# Patient Record
Sex: Female | Born: 1990
Health system: Southern US, Community
[De-identification: ages and names within clinical notes are randomized; demographics above are authoritative.]

## PROBLEM LIST (undated history)

## (undated) ENCOUNTER — Inpatient Hospital Stay (HOSPITAL_COMMUNITY): Payer: Self-pay

## (undated) DIAGNOSIS — O99345 Other mental disorders complicating the puerperium: Secondary | ICD-10-CM

## (undated) DIAGNOSIS — J383 Other diseases of vocal cords: Secondary | ICD-10-CM

## (undated) DIAGNOSIS — J45909 Unspecified asthma, uncomplicated: Secondary | ICD-10-CM

## (undated) DIAGNOSIS — R000 Tachycardia, unspecified: Secondary | ICD-10-CM

## (undated) DIAGNOSIS — F32A Depression, unspecified: Secondary | ICD-10-CM

## (undated) DIAGNOSIS — F419 Anxiety disorder, unspecified: Secondary | ICD-10-CM

## (undated) DIAGNOSIS — F329 Major depressive disorder, single episode, unspecified: Secondary | ICD-10-CM

## (undated) DIAGNOSIS — F53 Postpartum depression: Secondary | ICD-10-CM

## (undated) DIAGNOSIS — M5126 Other intervertebral disc displacement, lumbar region: Secondary | ICD-10-CM

## (undated) DIAGNOSIS — F4310 Post-traumatic stress disorder, unspecified: Secondary | ICD-10-CM

## (undated) HISTORY — PX: NO PAST SURGERIES: SHX2092

## (undated) HISTORY — DX: Other intervertebral disc displacement, lumbar region: M51.26

---

## 2014-06-23 ENCOUNTER — Emergency Department
Admission: EM | Admit: 2014-06-23 | Discharge: 2014-06-23 | Disposition: A | Discharge status: Home / Self Care | Payer: Managed Care, Other (non HMO) | Attending: Emergency Medicine | Admitting: Emergency Medicine

## 2014-06-23 ENCOUNTER — Encounter: Payer: Self-pay | Admitting: *Deleted

## 2014-06-23 DIAGNOSIS — Z79899 Other long term (current) drug therapy: Secondary | ICD-10-CM | POA: Insufficient documentation

## 2014-06-23 DIAGNOSIS — J45901 Unspecified asthma with (acute) exacerbation: Secondary | ICD-10-CM | POA: Insufficient documentation

## 2014-06-23 DIAGNOSIS — R000 Tachycardia, unspecified: Secondary | ICD-10-CM | POA: Insufficient documentation

## 2014-06-23 DIAGNOSIS — Y9389 Activity, other specified: Secondary | ICD-10-CM | POA: Insufficient documentation

## 2014-06-23 DIAGNOSIS — T7840XA Allergy, unspecified, initial encounter: Secondary | ICD-10-CM | POA: Insufficient documentation

## 2014-06-23 DIAGNOSIS — Y998 Other external cause status: Secondary | ICD-10-CM | POA: Diagnosis not present

## 2014-06-23 DIAGNOSIS — Y9289 Other specified places as the place of occurrence of the external cause: Secondary | ICD-10-CM | POA: Diagnosis not present

## 2014-06-23 HISTORY — DX: Unspecified asthma, uncomplicated: J45.909

## 2014-06-23 HISTORY — DX: Tachycardia, unspecified: R00.0

## 2014-06-23 MED ORDER — PREDNISONE 20 MG PO TABS: 60.0000 mg | ORAL_TABLET | Freq: Every day | ORAL | Status: AC

## 2014-06-23 MED ORDER — FAMOTIDINE IN NACL 20-0.9 MG/50ML-% IV SOLN
20.0000 mg | Freq: Once | INTRAVENOUS | Status: AC
  Administered 2014-06-23: 20 mg via INTRAVENOUS

## 2014-06-23 MED ORDER — FAMOTIDINE IN NACL 20-0.9 MG/50ML-% IV SOLN
INTRAVENOUS | Status: AC
  Filled 2014-06-23: qty 50

## 2014-06-23 NOTE — ED Notes (Signed)
Per EMS pt from [facility] due to allergic reaction to cinnamon. Upon arrival to [facility] pt in obvious distress, with stridor. Pt given 0.3 EPI SQ by [facility] and duo neb. EMS gave 125 of solumedrol, 25 of bendaryl and 1 albuterol treatment. Pt took 1 benadryl before going to [facility]. 20G LAC established. VSS. Hx of sinus tachycardia.

## 2014-06-23 NOTE — ED Provider Notes (Signed)
[facility] Emergency Department Provider Note  ____________________________________________  Time seen: Approximately 9:51 AM  I have reviewed the triage vital signs and the nursing notes.   HISTORY  Chief Complaint Allergic Reaction    HPI Jasmine NormanLeann Gabor is a 24 y.o. female with a known cinnamon allergy to accidentally ate a cinnamon doughnut today. She thereafter became short of breath and felt swelling in her throat. She went directly to urgent care where she was given IM epinephrine as well as Solu-Medrol and an albuterol nebulizer.She was given epinephrine about 20 minutes prior to arrival. She says the symptoms have begun to remit. She is not feeling swelling in her throat anymore. She denies ever having a rash. She does note a history of baseline tachycardia with a heart never lower than 90 and up to 180 with exertion. Says that there is no possibility that she could be pregnant. Has an Implanon.   Past Medical History  Diagnosis Date  . Asthma   . Sinus tachycardia     There are no active problems to display for this patient.   History reviewed. No pertinent past surgical history.  Current Outpatient Rx  Name  Route  Sig  Dispense  Refill  . albuterol (PROVENTIL HFA;VENTOLIN HFA) 108 (90 BASE) MCG/ACT inhaler   Inhalation   Inhale 2 puffs into the lungs every 6 (six) hours as needed for wheezing or shortness of breath.         . etonogestrel (NEXPLANON) 68 MG IMPL implant   Subdermal   1 each by Subdermal route once.         . Prenatal Vit-Fe Fumarate-FA (PRENATAL MULTIVITAMIN) TABS tablet   Oral   Take 1 tablet by mouth daily at 12 noon.         . sertraline (ZOLOFT) 100 MG tablet   Oral   Take 100 mg by mouth at bedtime.           Allergies Cinnamon  History reviewed. No pertinent family history.  Social History History  Substance Use Topics  . Smoking status: Never Smoker   . Smokeless tobacco: Not on file  . Alcohol  Use: Yes    Review of Systems Constitutional: No fever/chills Eyes: No visual changes. ENT: No sore throat. Cardiovascular: Denies chest pain. Respiratory: Denies shortness of breath. Gastrointestinal: No abdominal pain.  No nausea, no vomiting.  No diarrhea.  No constipation. Genitourinary: Negative for dysuria. Musculoskeletal: Negative for back pain. Skin: Negative for rash. Neurological: Negative for headaches, focal weakness or numbness.  10-point ROS otherwise negative.  ____________________________________________   PHYSICAL EXAM:  VITAL SIGNS: ED Triage Vitals  Enc Vitals Group     BP --      Pulse --      Resp --      Temp --      Temp src --      SpO2 --      Weight --      Height --      Head Cir --      Peak Flow --      Pain Score --      Pain Loc --      Pain Edu? --      Excl. in GC? --     Constitutional: Alert and oriented. Well appearing and in no acute distress. Eyes: Conjunctivae are normal. PERRL. EOMI. Head: Atraumatic. Nose: No congestion/rhinnorhea. Mouth/Throat: Mucous membranes are moist.  Oropharynx non-erythematous. Neck: No stridor.  Cardiovascular: Normal rate, regular rhythm. Grossly normal heart sounds.  Good peripheral circulation. Respiratory: Normal respiratory effort.  No retractions. Lungs CTAB. Gastrointestinal: Soft and nontender. No distention. No abdominal bruits. No CVA tenderness. Musculoskeletal: No lower extremity tenderness nor edema.  No joint effusions. Neurologic:  Normal speech and language. No gross focal neurologic deficits are appreciated. Speech is normal. No gait instability. Skin:  Skin is warm, dry and intact. No rash noted. Psychiatric: Mood and affect are normal. Speech and behavior are normal.  ____________________________________________   LABS (all labs ordered are listed, but only abnormal results are displayed)  Labs Reviewed - No data to  display ____________________________________________  EKG   ____________________________________________  RADIOLOGY   ____________________________________________   PROCEDURES    ____________________________________________   INITIAL IMPRESSION / ASSESSMENT AND PLAN / ED COURSE  Pertinent labs & imaging results that were available during my care of the patient were reviewed by me and considered in my medical decision making (see chart for details).  ----------------------------------------- 2:08 PM on 06/23/2014 -----------------------------------------  Patient completely relieved of symptoms. No stridor or respiratory distress. Will be discharged home. Has EpiPen at home. Will be given a course of steroids. Known trigger of cinnamon causing symptoms today. ____________________________________________   FINAL CLINICAL IMPRESSION(S) / ED DIAGNOSES  Acute allergic reaction. Resolved. Initial visit.     Arelia Longestavid M Schaevitz, MD 06/23/14 1409

## 2015-05-08 ENCOUNTER — Encounter (HOSPITAL_BASED_OUTPATIENT_CLINIC_OR_DEPARTMENT_OTHER): Payer: Self-pay | Admitting: *Deleted

## 2015-05-08 ENCOUNTER — Emergency Department (HOSPITAL_BASED_OUTPATIENT_CLINIC_OR_DEPARTMENT_OTHER)
Admission: EM | Admit: 2015-05-08 | Discharge: 2015-05-08 | Disposition: A | Discharge status: Home / Self Care | Payer: Managed Care, Other (non HMO) | Attending: Emergency Medicine | Admitting: Emergency Medicine

## 2015-05-08 DIAGNOSIS — R000 Tachycardia, unspecified: Secondary | ICD-10-CM | POA: Insufficient documentation

## 2015-05-08 DIAGNOSIS — B349 Viral infection, unspecified: Secondary | ICD-10-CM

## 2015-05-08 DIAGNOSIS — J45901 Unspecified asthma with (acute) exacerbation: Secondary | ICD-10-CM | POA: Diagnosis not present

## 2015-05-08 DIAGNOSIS — R0602 Shortness of breath: Secondary | ICD-10-CM | POA: Diagnosis present

## 2015-05-08 DIAGNOSIS — E669 Obesity, unspecified: Secondary | ICD-10-CM | POA: Diagnosis not present

## 2015-05-08 DIAGNOSIS — N390 Urinary tract infection, site not specified: Secondary | ICD-10-CM | POA: Diagnosis not present

## 2015-05-08 DIAGNOSIS — J9801 Acute bronchospasm: Secondary | ICD-10-CM

## 2015-05-08 DIAGNOSIS — J069 Acute upper respiratory infection, unspecified: Secondary | ICD-10-CM | POA: Diagnosis not present

## 2015-05-08 DIAGNOSIS — Z79899 Other long term (current) drug therapy: Secondary | ICD-10-CM | POA: Insufficient documentation

## 2015-05-08 LAB — URINE MICROSCOPIC-ADD ON

## 2015-05-08 LAB — URINALYSIS, ROUTINE W REFLEX MICROSCOPIC
Bilirubin Urine: NEGATIVE
GLUCOSE, UA: NEGATIVE mg/dL
KETONES UR: NEGATIVE mg/dL
NITRITE: NEGATIVE
PROTEIN: NEGATIVE mg/dL
Specific Gravity, Urine: 1.028 (ref 1.005–1.030)
pH: 6 (ref 5.0–8.0)

## 2015-05-08 MED ORDER — CEPHALEXIN 500 MG PO CAPS: 1000.0000 mg | ORAL_CAPSULE | Freq: Two times a day (BID) | ORAL | Status: DC

## 2015-05-08 MED ORDER — IPRATROPIUM-ALBUTEROL 0.5-2.5 (3) MG/3ML IN SOLN
3.0000 mL | Freq: Once | RESPIRATORY_TRACT | Status: AC
  Administered 2015-05-08: 3 mL via RESPIRATORY_TRACT
  Filled 2015-05-08: qty 3

## 2015-05-08 MED ORDER — ALBUTEROL SULFATE (2.5 MG/3ML) 0.083% IN NEBU
2.5000 mg | INHALATION_SOLUTION | Freq: Once | RESPIRATORY_TRACT | Status: AC
  Administered 2015-05-08: 2.5 mg via RESPIRATORY_TRACT
  Filled 2015-05-08: qty 3

## 2015-05-08 MED ORDER — ALBUTEROL (5 MG/ML) CONTINUOUS INHALATION SOLN
INHALATION_SOLUTION | RESPIRATORY_TRACT | Status: AC
  Administered 2015-05-08: 10 mg/h via RESPIRATORY_TRACT
  Filled 2015-05-08: qty 20

## 2015-05-08 MED ORDER — PREDNISONE 50 MG PO TABS
60.0000 mg | ORAL_TABLET | Freq: Once | ORAL | Status: AC
  Administered 2015-05-08: 60 mg via ORAL
  Filled 2015-05-08: qty 1

## 2015-05-08 MED ORDER — ALBUTEROL (5 MG/ML) CONTINUOUS INHALATION SOLN
10.0000 mg/h | INHALATION_SOLUTION | RESPIRATORY_TRACT | Status: AC
  Administered 2015-05-08: 10 mg/h via RESPIRATORY_TRACT

## 2015-05-08 MED ORDER — PREDNISONE 20 MG PO TABS: ORAL_TABLET | ORAL | Status: DC

## 2015-05-08 MED ORDER — ALBUTEROL SULFATE (2.5 MG/3ML) 0.083% IN NEBU: 2.5000 mg | INHALATION_SOLUTION | Freq: Four times a day (QID) | RESPIRATORY_TRACT | Status: DC | PRN

## 2015-05-08 MED FILL — predniSONE 20 MG TABS: 20 | 5 days supply | Qty: 11 | Fill #0

## 2015-05-08 MED FILL — ALBUTEROL 0.083% INHAL SOLN: (2.5 MG/3ML | 6 days supply | Qty: 75 | Fill #0

## 2015-05-08 MED FILL — CEPHALEXIN 500 MG CAPSULE: 500 | 10 days supply | Qty: 40 | Fill #0

## 2015-05-08 NOTE — ED Provider Notes (Signed)
CSN: [id number]     Arrival date & time 05/08/15  1001 History   First MD Initiated Contact with Patient 05/08/15 1005     No chief complaint on file.    (Consider location/radiation/quality/duration/timing/severity/associated sxs/prior Treatment) HPI   25 year old female with history of asthma and history of sinus tachycardia presents for evaluation of shortness of breath. Patient mentioned for the past 2 weeks she has had cold symptoms including congestion cough sneeze body aches, and other family member has the same. Her symptoms seems to be improving but for the past several days she has had persistent shortness of breath and wheezing worsening at nighttime. She used her inhaler twice daily and use a rescue inhaler 4 times a day last use was this morning without adequate improvement. She denies having any fever, severe headache, ear pain, sore throat, abdominal pain, back pain, nausea vomiting diarrhea, or rash. She did complain of some mild burning urination for the past several days without any vaginal discharge. She is currently has explanon contraceptive device.  She has no prior history of PE or DVT, no recent surgery, prolonged bed rest, unilateral leg swelling or calf pain, active cancer or hemoptysis.  Past Medical History  Diagnosis Date  . Asthma   . Sinus tachycardia    No past surgical history on file. No family history on file. Social History  Substance Use Topics  . Smoking status: Never Smoker   . Smokeless tobacco: Not on file  . Alcohol Use: Yes   OB History    No data available     Review of Systems  All other systems reviewed and are negative.     Allergies  Cinnamon  Home Medications   Prior to Admission medications   Medication Sig Start Date End Date Taking? Authorizing Provider  albuterol (PROVENTIL HFA;VENTOLIN HFA) 108 (90 BASE) MCG/ACT inhaler Inhale 2 puffs into the lungs every 6 (six) hours as needed for wheezing or shortness of breath.     Historical Provider, MD  etonogestrel (NEXPLANON) 68 MG IMPL implant 1 each by Subdermal route once.    Historical Provider, MD  Prenatal Vit-Fe Fumarate-FA (PRENATAL MULTIVITAMIN) TABS tablet Take 1 tablet by mouth daily at 12 noon.    Historical Provider, MD  sertraline (ZOLOFT) 100 MG tablet Take 100 mg by mouth at bedtime.    Historical Provider, MD   BP 136/63 mmHg  Pulse 102  Temp(Src) 98.1 F (36.7 C) (Oral)  Resp 22  Ht 5\' 5"  (1.651 m)  Wt 88.451 kg  BMI 32.45 kg/m2  SpO2 98% Physical Exam  Constitutional: She appears well-developed and well-nourished. No distress.  Obese Caucasian female nontoxic in appearance  HENT:  Head: Atraumatic.  Right Ear: External ear normal.  Left Ear: External ear normal.  Nose: Nose normal.  Mouth/Throat: Oropharynx is clear and moist. No oropharyngeal exudate.  Eyes: Conjunctivae are normal.  Neck: Normal range of motion. Neck supple. No tracheal deviation present.  Cardiovascular:  Mild tachycardia without murmurs rubs or gallops  Pulmonary/Chest:  Inspiratory and expiratory wheezes with decreased breath sounds but no rhonchi or rales  Mildly tachypneic and tachycardic but able to speak in complete sentences.  Abdominal: Soft. There is no tenderness.  Musculoskeletal: She exhibits no edema.  Neurological: She is alert.  Skin: No rash noted.  Psychiatric: She has a normal mood and affect.  Nursing note and vitals reviewed.   ED Course  Procedures (including critical care time) Labs Review Labs Reviewed  URINALYSIS, ROUTINE W  REFLEX MICROSCOPIC (NOT AT [facility]) - Abnormal; Notable for the following:    Hgb urine dipstick SMALL (*)    Leukocytes, UA MODERATE (*)    All other components within normal limits  URINE MICROSCOPIC-ADD ON - Abnormal; Notable for the following:    Squamous Epithelial / LPF 0-5 (*)    Bacteria, UA MANY (*)    All other components within normal limits    Imaging Review No results found. I have personally  reviewed and evaluated these images and lab results as part of my medical decision-making.   EKG Interpretation None      MDM   Final diagnoses:  Acute bronchospasm due to viral infection  UTI (lower urinary tract infection)    BP 122/71 mmHg  Pulse 130  Temp(Src) 98.1 F (36.7 C) (Oral)  Resp 20  Ht  (1.651 m)  Wt 88.451 kg  BMI 32.45 kg/m2  SpO2 95%   10:20 AM Patient presents with asthma exacerbation likely secondary to recent URI symptoms. Will provide breathing treatment including steroid, albuterol and Atrovent. She did complain of some mild burning urination, UA obtained. Low suspicion for PE or pneumonia.  12:03 PM UA showing signs of UTI.  Will treat with abx.  Pt felt better after receiving breathing treatment.  Pt is tachycardic 2/2 albuterol use.  She ambulates maintaining O2 above 95%.  She feels comfortable going home.  Return precaution discussed.    Fayrene Helper, PA-C 05/08/15 1208  Geoffery Lyons, MD 05/08/15 [phone]

## 2015-05-08 NOTE — ED Notes (Signed)
C/o cold sx  2 weeks ago. States she feels like her asthma is worse because of cold sx. Also c/o burning after urination and frequency.

## 2015-05-08 NOTE — Discharge Instructions (Signed)
Asthma, Acute Bronchospasm °Acute bronchospasm caused by asthma is also referred to as an asthma attack. Bronchospasm means your air passages become narrowed. The narrowing is caused by inflammation and tightening of the muscles in the air tubes (bronchi) in your lungs. This can make it hard to breathe or cause you to wheeze and cough. °CAUSES °Possible triggers are: °· Animal dander from the skin, hair, or feathers of animals. °· Dust mites contained in house dust. °· Cockroaches. °· Pollen from trees or grass. °· Mold. °· Cigarette or tobacco smoke. °· Air pollutants such as dust, household cleaners, hair sprays, aerosol sprays, paint fumes, strong chemicals, or strong odors. °· Cold air or weather changes. Cold air may trigger inflammation. Winds increase molds and pollens in the air. °· Strong emotions such as crying or laughing hard. °· Stress. °· Certain medicines such as aspirin or beta-blockers. °· Sulfites in foods and drinks, such as dried fruits and wine. °· Infections or inflammatory conditions, such as a flu, cold, or inflammation of the nasal membranes (rhinitis). °· Gastroesophageal reflux disease (GERD). GERD is a condition where stomach acid backs up into your esophagus. °· Exercise or strenuous activity. °SIGNS AND SYMPTOMS  °· Wheezing. °· Excessive coughing, particularly at night. °· Chest tightness. °· Shortness of breath. °DIAGNOSIS  °Your health care provider will ask you about your medical history and perform a physical exam. A chest X-ray or blood testing may be performed to look for other causes of your symptoms or other conditions that may have triggered your asthma attack.  °TREATMENT  °Treatment is aimed at reducing inflammation and opening up the airways in your lungs.  Most asthma attacks are treated with inhaled medicines. These include quick relief or rescue medicines (such as bronchodilators) and controller medicines (such as inhaled corticosteroids). These medicines are sometimes  given through an inhaler or a nebulizer. Systemic steroid medicine taken by mouth or given through an IV tube also can be used to reduce the inflammation when an attack is moderate or severe. Antibiotic medicines are only used if a bacterial infection is present.  °HOME CARE INSTRUCTIONS  °· Rest. °· Drink plenty of liquids. This helps the mucus to remain thin and be easily coughed up. Only use caffeine in moderation and do not use alcohol until you have recovered from your illness. °· Do not smoke. Avoid being exposed to secondhand smoke. °· You play a critical role in keeping yourself in good health. Avoid exposure to things that cause you to wheeze or to have breathing problems. °· Keep your medicines up-to-date and available. Carefully follow your health care provider's treatment plan. °· Take your medicine exactly as prescribed. °· When pollen or pollution is bad, keep windows closed and use an air conditioner or go to places with air conditioning. °· Asthma requires careful medical care. See your health care provider for a follow-up as advised. If you are more than [redacted] weeks pregnant and you were prescribed any new medicines, let your obstetrician know about the visit and how you are doing. Follow up with your health care provider as directed. °· After you have recovered from your asthma attack, make an appointment with your outpatient doctor to talk about ways to reduce the likelihood of future attacks. If you do not have a doctor who manages your asthma, make an appointment with a primary care doctor to discuss your asthma. °SEEK IMMEDIATE MEDICAL CARE IF:  °· You are getting worse. °· You have trouble breathing. If severe, call your local   emergency services (911 in the [place]  You develop chest pain or discomfort.  You are vomiting.  You are not able to keep fluids down.  You are coughing up yellow, green, brown, or bloody sputum.  You have a fever and your symptoms suddenly get worse.  You have  trouble swallowing. MAKE SURE YOU:   Understand these instructions.  Will watch your condition.  Will get help right away if you are not doing well or get worse.   This information is not intended to replace advice given to you by your health care provider. Make sure you discuss any questions you have with your health care provider.   Document Released: 05/18/2006 Document Revised: 02/05/2013 Document Reviewed: 08/08/2012 Elsevier Interactive Patient Education 2016 Else[employer] Inc[employer]  Urinary Tract Infection A urinary tract infection (UTI) can occur any place along the urinary tract. The tract includes the kidneys, ureters, bladder, and urethra. A type of germ called bacteria often causes a UTI. UTIs are often helped with antibiotic medicine.  HOME CARE   If given, take antibiotics as told by your doctor. Finish them even if you start to feel better.  Drink enough fluids to keep your pee (urine) clear or pale yellow.  Avoid tea, drinks with caffeine, and bubbly (carbonated) drinks.  Pee often. Avoid holding your pee in for a long time.  Pee before and after having sex (intercourse).  Wipe from front to back after you poop (bowel movement) if you are a woman. Use each tissue only once. GET HELP RIGHT AWAY IF:   You have back pain.  You have lower belly (abdominal) pain.  You have chills.  You feel sick to your stomach (nauseous).  You throw up (vomit).  Your burning or discomfort with peeing does not go away.  You have a fever.  Your symptoms are not better in 3 days. MAKE SURE YOU:   Understand these instructions.  Will watch your condition.  Will get help right away if you are not doing well or get worse.   This information is not intended to replace advice given to you by your health care provider. Make sure you discuss any questions you have with your health care provider.   Document Released: 07/20/2007 Document Revised: 02/21/2014 Document Reviewed:  09/01/2011 Elsevier Interactive Patient Education [employer] Else[employer] Inc.

## 2015-05-08 NOTE — ED Notes (Signed)
Ambulated pt around ns station. Pt tolerated well, states she feels better with her breathing than when she came in but still is short of breath. Pulse ox before ambulation 95% and HR 136. During and after walk 95-99% and HR 150.

## 2015-06-03 ENCOUNTER — Emergency Department (HOSPITAL_BASED_OUTPATIENT_CLINIC_OR_DEPARTMENT_OTHER): Payer: Managed Care, Other (non HMO)

## 2015-06-03 ENCOUNTER — Encounter (HOSPITAL_BASED_OUTPATIENT_CLINIC_OR_DEPARTMENT_OTHER): Payer: Self-pay

## 2015-06-03 ENCOUNTER — Emergency Department (HOSPITAL_BASED_OUTPATIENT_CLINIC_OR_DEPARTMENT_OTHER)
Admission: EM | Admit: 2015-06-03 | Discharge: 2015-06-03 | Disposition: A | Discharge status: Home / Self Care | Payer: Managed Care, Other (non HMO) | Attending: Emergency Medicine | Admitting: Emergency Medicine

## 2015-06-03 DIAGNOSIS — J45901 Unspecified asthma with (acute) exacerbation: Secondary | ICD-10-CM | POA: Insufficient documentation

## 2015-06-03 DIAGNOSIS — J189 Pneumonia, unspecified organism: Secondary | ICD-10-CM | POA: Insufficient documentation

## 2015-06-03 DIAGNOSIS — R0602 Shortness of breath: Secondary | ICD-10-CM | POA: Diagnosis present

## 2015-06-03 HISTORY — DX: Other diseases of vocal cords: J38.3

## 2015-06-03 MED ORDER — PREDNISONE 20 MG PO TABS: 40.0000 mg | ORAL_TABLET | Freq: Every day | ORAL | Status: DC

## 2015-06-03 MED ORDER — PREDNISONE 50 MG PO TABS
60.0000 mg | ORAL_TABLET | Freq: Once | ORAL | Status: AC
  Administered 2015-06-03: 60 mg via ORAL
  Filled 2015-06-03: qty 1

## 2015-06-03 MED ORDER — IPRATROPIUM-ALBUTEROL 0.5-2.5 (3) MG/3ML IN SOLN
3.0000 mL | Freq: Once | RESPIRATORY_TRACT | Status: AC
  Administered 2015-06-03: 3 mL via RESPIRATORY_TRACT
  Filled 2015-06-03: qty 3

## 2015-06-03 MED ORDER — AMOXICILLIN-POT CLAVULANATE 875-125 MG PO TABS
1.0000 | ORAL_TABLET | Freq: Once | ORAL | Status: AC
  Administered 2015-06-03: 1 via ORAL
  Filled 2015-06-03: qty 1

## 2015-06-03 MED ORDER — AZITHROMYCIN 250 MG PO TABS
500.0000 mg | ORAL_TABLET | Freq: Once | ORAL | Status: AC
  Administered 2015-06-03: 500 mg via ORAL
  Filled 2015-06-03: qty 2

## 2015-06-03 MED ORDER — ALBUTEROL SULFATE (2.5 MG/3ML) 0.083% IN NEBU
2.5000 mg | INHALATION_SOLUTION | Freq: Once | RESPIRATORY_TRACT | Status: AC
  Administered 2015-06-03: 2.5 mg via RESPIRATORY_TRACT
  Filled 2015-06-03: qty 3

## 2015-06-03 MED ORDER — AMOXICILLIN-POT CLAVULANATE 875-125 MG PO TABS: 1.0000 | ORAL_TABLET | Freq: Two times a day (BID) | ORAL | Status: DC

## 2015-06-03 MED ORDER — ALBUTEROL SULFATE HFA 108 (90 BASE) MCG/ACT IN AERS
2.0000 | INHALATION_SPRAY | Freq: Once | RESPIRATORY_TRACT | Status: AC
  Administered 2015-06-03: 2 via RESPIRATORY_TRACT
  Filled 2015-06-03: qty 6.7

## 2015-06-03 MED ORDER — AZITHROMYCIN 250 MG PO TABS: 250.0000 mg | ORAL_TABLET | Freq: Every day | ORAL | Status: DC

## 2015-06-03 MED FILL — predniSONE 20 MG TABS: 20 | 4 days supply | Qty: 8 | Fill #0

## 2015-06-03 MED FILL — AMOX-CLAV 875-125 MG TABLET: 875-125 | 7 days supply | Qty: 14 | Fill #0

## 2015-06-03 MED FILL — AZITHROMYCIN 250 MG TABLET: 250 | 4 days supply | Qty: 4 | Fill #0

## 2015-06-03 NOTE — ED Provider Notes (Signed)
CSN: [id number]     Arrival date & time 06/03/15  1229 History   First MD Initiated Contact with Patient 06/03/15 1240     Chief Complaint  Patient presents with  . Shortness of Breath     (Consider location/radiation/quality/duration/timing/severity/associated sxs/prior Treatment) HPI  25 year old female with a history of asthma presents with dyspnea. Has been not feeling well with asthma/dyspnea x 2+ months. Worse after going to [place] and breathing dry air. Came back last night and tried to see her PCP but couldn't get an appointment. Nebulizers at home have not helped. Seen here last month for same, steroids helped some but it never went away. Feels like her asthma but more severe. Has had a cough that went from clear sputum to brown. Now increased nasal congestion. No fevers. No chest pain. No calf pain or swelling.   Past Medical History  Diagnosis Date  . Asthma   . Sinus tachycardia (HCC)   . Vocal cord dysfunction    History reviewed. No pertinent past surgical history. No family history on file. Social History  Substance Use Topics  . Smoking status: Never Smoker   . Smokeless tobacco: None  . Alcohol Use: Yes   OB History    No data available     Review of Systems  Constitutional: Negative for fever.  HENT: Positive for congestion.   Respiratory: Positive for cough, shortness of breath and wheezing.   Cardiovascular: Negative for chest pain and leg swelling.  Gastrointestinal: Negative for vomiting.  All other systems reviewed and are negative.     Allergies  Cinnamon  Home Medications   Prior to Admission medications   Medication Sig Start Date End Date Taking? Authorizing Provider  albuterol (PROVENTIL HFA;VENTOLIN HFA) 108 (90 BASE) MCG/ACT inhaler Inhale 2 puffs into the lungs every 6 (six) hours as needed for wheezing or shortness of breath.    Historical Provider, MD  albuterol (PROVENTIL) (2.5 MG/3ML) 0.083% nebulizer solution Take 3 mLs (2.5 mg  total) by nebulization every 6 (six) hours as needed for wheezing or shortness of breath. 05/08/15   Fayrene HelperBowie Tran, PA-C  etonogestrel (NEXPLANON) 68 MG IMPL implant 1 each by Subdermal route once.    Historical Provider, MD   BP 114/74 mmHg  Pulse 112  Temp(Src) 98.6 F (37 C) (Oral)  Resp 24  SpO2 100% Physical Exam  Constitutional: She is oriented to person, place, and time. She appears well-developed and well-nourished.  HENT:  Head: Normocephalic and atraumatic.  Right Ear: External ear normal.  Left Ear: External ear normal.  Nose: Nose normal.  Eyes: Right eye exhibits no discharge. Left eye exhibits no discharge.  Cardiovascular: Regular rhythm and normal heart sounds.  Tachycardia present.   Pulmonary/Chest: Effort normal. Tachypnea noted. She has wheezes.  Diffuse expiratory wheezes and prolonged expiration. Speaks in full sentences  Abdominal: Soft. There is no tenderness.  Neurological: She is alert and oriented to person, place, and time.  Skin: Skin is warm and dry.  Nursing note and vitals reviewed.   ED Course  Procedures (including critical care time) Labs Review Labs Reviewed - No data to display  Imaging Review Dg Chest 2 View  06/03/2015  CLINICAL DATA:  Cough and congestion. Shortness of breath. Duration: 2 months. EXAM: CHEST  2 VIEW COMPARISON:  03/31/2013 FINDINGS: Airway thickening. Indistinct airspace opacity anteriorly in the left upper lobe best appreciated on the lateral projection. Indistinct and slightly nodular airspace opacity in the right middle lobe. Cardiac  and mediastinal margins appear normal.  No pleural effusion. IMPRESSION: 1. Airway thickening, with indistinct airspace opacities anteriorly in the left upper lobe and in the right middle lobe. These airspace opacities could be from atelectasis or multilobar pneumonia. Electronically Signed   By: Gaylyn Rong M.D.   On: 06/03/2015 13:17   I have personally reviewed and evaluated these images  and lab results as part of my medical decision-making.   EKG Interpretation None      MDM   Final diagnoses:  Community acquired pneumonia  Asthma exacerbation    Patient presents with dyspnea and wheezing. Shortness of breath is significantly improved after 2 albuterol treatments. She was also given prednisone. She has had an acute worsening of her symptoms over the last several days and chest x-ray does show concerning opacities for pneumonia. Given she feels much better and feels well and is to go home I think she is stable for discharge. She is tachycardic but I think is related to the albuterol. Her work of breathing is much better. Given that she will be on prednisone and is at somewhat increased risk for drug-resistant organisms I will treat her with Augmentin and azithromycin as opposed to Levaquin due to tendon injury risk with fluoroquinolones and steroids. She will continue albuterol treatment at home. Discussed strict return precautions.    Pricilla Loveless, MD 06/03/15 1536

## 2015-06-03 NOTE — ED Notes (Signed)
Pt states she has not been well x 2 months-sob,cough,fever-seen x 1 here for same

## 2016-10-10 ENCOUNTER — Inpatient Hospital Stay (HOSPITAL_COMMUNITY)
Admission: AD | Admit: 2016-10-10 | Discharge: 2016-10-10 | Disposition: A | Discharge status: Home / Self Care | Payer: Managed Care, Other (non HMO) | Source: Ambulatory Visit | Attending: Obstetrics & Gynecology | Admitting: Obstetrics & Gynecology

## 2016-10-10 ENCOUNTER — Encounter (HOSPITAL_COMMUNITY): Payer: Self-pay | Admitting: *Deleted

## 2016-10-10 DIAGNOSIS — Z3202 Encounter for pregnancy test, result negative: Secondary | ICD-10-CM | POA: Insufficient documentation

## 2016-10-10 DIAGNOSIS — F458 Other somatoform disorders: Secondary | ICD-10-CM | POA: Diagnosis not present

## 2016-10-10 LAB — POCT PREGNANCY, URINE: PREG TEST UR: NEGATIVE

## 2016-10-10 NOTE — MAU Note (Signed)
No FHR noted with doppler.

## 2016-10-10 NOTE — MAU Note (Signed)
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  Pos HPT (faint) in March, was seen by MD but UPT was negative, MD wouldn't do further testing or U/S due to neg UPT.  Pt states she has found doppler FHR @ home.  Denies pain, bleeding or LOF.

## 2016-10-10 NOTE — MAU Provider Note (Signed)
Patient is here to be seen for a prenatal visit because she reports that she has a "beta HCG" insufficiency and will not have a positive pregnancy test. She states that she is [redacted] weeks pregnant. She states that she has seen two providers in [place] (cannot remember their names) but that they all want to give her medications to harm her baby.   She says that she has researched beta hcg insufficiency and that she had this with her last pregnancy. She states that she feels positive movements and kicks. She says that she dopplers the baby at home and finds the heartbeat under her rib cage on the left side.  History     CSN: [id number]  Arrival date and time: 10/10/16 1239   None     Chief Complaint  Patient presents with  . Possible Pregnancy   HPI  OB History    No data available      Past Medical History:  Diagnosis Date  . Asthma   . Sinus tachycardia   . Vocal cord dysfunction     No past surgical history on file.  No family history on file.  Social History  Substance Use Topics  . Smoking status: Never Smoker  . Smokeless tobacco: Not on file  . Alcohol use Yes    Allergies:  Allergies  Allergen Reactions  . Cinnamon Anaphylaxis    Prescriptions Prior to Admission  Medication Sig Dispense Refill Last Dose  . albuterol (PROVENTIL HFA;VENTOLIN HFA) 108 (90 BASE) MCG/ACT inhaler Inhale 2 puffs into the lungs every 6 (six) hours as needed for wheezing or shortness of breath.   06/23/2014 at Unknown time  . albuterol (PROVENTIL) (2.5 MG/3ML) 0.083% nebulizer solution Take 3 mLs (2.5 mg total) by nebulization every 6 (six) hours as needed for wheezing or shortness of breath. 75 mL 1   . amoxicillin-clavulanate (AUGMENTIN) 875-125 MG tablet Take 1 tablet by mouth 2 (two) times daily. One po bid x 7 days 14 tablet 0   . azithromycin (ZITHROMAX) 250 MG tablet Take 1 tablet (250 mg total) by mouth daily. 4 tablet 0   . etonogestrel (NEXPLANON) 68 MG IMPL implant 1 each by  Subdermal route once.   06/23/2014 at Unknown time  . predniSONE (DELTASONE) 20 MG tablet Take 2 tablets (40 mg total) by mouth daily. 8 tablet 0     Review of Systems  Medical screen complete Physical Exam   Blood pressure 130/84, pulse 87, temperature 98.3 F (36.8 C), temperature source Oral, resp. rate 18, height 5\' 5"  (1.651 m), weight 215 lb (97.5 kg), last menstrual period 03/20/2016.  Physical Exam  Constitutional: She is oriented to person, place, and time. She appears well-developed and well-nourished.  HENT:  Head: Normocephalic.  Eyes: Pupils are equal, round, and reactive to light.  Neck: Normal range of motion.  Musculoskeletal: Normal range of motion.  Neurological: She is alert and oriented to person, place, and time. She has normal reflexes.    MAU Course  Procedures  MDM -Patient claims to be [redacted] weeks pregnant and that she dopplers her baby at home, however no FHR was able to be dopplered here even though patient reports strong fetal movements. Patient also claims to doppler her baby under her left rib cage, which would not be consistent with a 20 week fetus.  -UPT was negative here (which patient claims is normal when one has a beta hcg insufficiency)  Assessment and Plan   1. Pseudocyesis  2. Patient advised to seek ob-gyn care if she desires to get pregnant; patient declined to talk further about her plan of care and left immediately.  Charlesetta Garibaldi Kooistra 10/10/2016, 4:21 PM

## 2017-12-15 ENCOUNTER — Inpatient Hospital Stay (HOSPITAL_COMMUNITY): Payer: Managed Care, Other (non HMO)

## 2017-12-15 ENCOUNTER — Inpatient Hospital Stay (HOSPITAL_COMMUNITY)
Admission: AD | Admit: 2017-12-15 | Discharge: 2017-12-15 | Disposition: A | Discharge status: Home / Self Care | Payer: Managed Care, Other (non HMO) | Source: Ambulatory Visit | Attending: Obstetrics & Gynecology | Admitting: Obstetrics & Gynecology

## 2017-12-15 ENCOUNTER — Other Ambulatory Visit: Payer: Self-pay

## 2017-12-15 ENCOUNTER — Encounter (HOSPITAL_COMMUNITY): Payer: Self-pay | Admitting: *Deleted

## 2017-12-15 DIAGNOSIS — O26891 Other specified pregnancy related conditions, first trimester: Secondary | ICD-10-CM | POA: Diagnosis not present

## 2017-12-15 DIAGNOSIS — O99341 Other mental disorders complicating pregnancy, first trimester: Secondary | ICD-10-CM | POA: Insufficient documentation

## 2017-12-15 DIAGNOSIS — F4310 Post-traumatic stress disorder, unspecified: Secondary | ICD-10-CM | POA: Diagnosis not present

## 2017-12-15 DIAGNOSIS — O26899 Other specified pregnancy related conditions, unspecified trimester: Secondary | ICD-10-CM

## 2017-12-15 DIAGNOSIS — R1030 Lower abdominal pain, unspecified: Secondary | ICD-10-CM | POA: Insufficient documentation

## 2017-12-15 DIAGNOSIS — R109 Unspecified abdominal pain: Secondary | ICD-10-CM

## 2017-12-15 DIAGNOSIS — Z3491 Encounter for supervision of normal pregnancy, unspecified, first trimester: Secondary | ICD-10-CM

## 2017-12-15 DIAGNOSIS — O99340 Other mental disorders complicating pregnancy, unspecified trimester: Secondary | ICD-10-CM

## 2017-12-15 DIAGNOSIS — F419 Anxiety disorder, unspecified: Secondary | ICD-10-CM

## 2017-12-15 DIAGNOSIS — Z3A08 8 weeks gestation of pregnancy: Secondary | ICD-10-CM | POA: Insufficient documentation

## 2017-12-15 HISTORY — DX: Postpartum depression: F53.0

## 2017-12-15 HISTORY — DX: Anxiety disorder, unspecified: F41.9

## 2017-12-15 HISTORY — DX: Depression, unspecified: F32.A

## 2017-12-15 HISTORY — DX: Major depressive disorder, single episode, unspecified: F32.9

## 2017-12-15 HISTORY — DX: Post-traumatic stress disorder, unspecified: F43.10

## 2017-12-15 HISTORY — DX: Other mental disorders complicating the puerperium: O99.345

## 2017-12-15 LAB — CBC
HEMATOCRIT: 37 % (ref 36.0–46.0)
Hemoglobin: 12.2 g/dL (ref 12.0–15.0)
MCH: 28.2 pg (ref 26.0–34.0)
MCHC: 33 g/dL (ref 30.0–36.0)
MCV: 85.5 fL (ref 80.0–100.0)
Platelets: 315 10*3/uL (ref 150–400)
RBC: 4.33 MIL/uL (ref 3.87–5.11)
RDW: 13.2 % (ref 11.5–15.5)
WBC: 11.1 10*3/uL — ABNORMAL HIGH (ref 4.0–10.5)
nRBC: 0 % (ref 0.0–0.2)

## 2017-12-15 LAB — URINALYSIS, ROUTINE W REFLEX MICROSCOPIC
BILIRUBIN URINE: NEGATIVE
Glucose, UA: NEGATIVE mg/dL
Ketones, ur: NEGATIVE mg/dL
LEUKOCYTES UA: NEGATIVE
Nitrite: NEGATIVE
Protein, ur: NEGATIVE mg/dL
Specific Gravity, Urine: 1.017 (ref 1.005–1.030)
pH: 6 (ref 5.0–8.0)

## 2017-12-15 LAB — WET PREP, GENITAL
CLUE CELLS WET PREP: NONE SEEN
Sperm: NONE SEEN
TRICH WET PREP: NONE SEEN
YEAST WET PREP: NONE SEEN

## 2017-12-15 LAB — ABO/RH: ABO/RH(D): O POS

## 2017-12-15 LAB — HCG, QUANTITATIVE, PREGNANCY: hCG, Beta Chain, Quant, S: 149948 m[IU]/mL — ABNORMAL HIGH (ref <5)

## 2017-12-15 LAB — POCT PREGNANCY, URINE: PREG TEST UR: POSITIVE — AB

## 2017-12-15 MED ORDER — SERTRALINE HCL 25 MG PO TABS: 25.0000 mg | ORAL_TABLET | Freq: Every day | ORAL | 2 refills | Status: DC

## 2017-12-15 NOTE — MAU Note (Signed)
Found out pregnant a couple wks ago. (several home tests) Is very excited about the pregnancy. is having hormone surges, and has had mental health problems in the past. Can't eat or sleep very well.  Having trouble functioning.  Seems to be getting worse not better.  Having almost hourly panic attacks, hyperventilates and can't get control of herself.  occ cramps, but not really painful. Head hurts and is dehydrated.

## 2017-12-15 NOTE — MAU Provider Note (Signed)
History     CSN: [id number]  Arrival date and time: 12/15/17 1557   First Provider Initiated Contact with Patient 12/15/17 1739      Chief Complaint  Patient presents with  . panic attacks  . Possible Pregnancy   HPI Jasmine Gates is a 27 y.o. G2P1001 at [redacted]w[redacted]d who presents with lower abdominal cramping and anxiety. She states she has been having cramping for about 2 weeks. Had positive HPT last week. Denies vaginal bleeding or discharge. She states since finding out she is pregnant she has had 6-7 panic attacks a day, making it hard to function. She has a hard time sleeping, does not eat and feels like she is dehydrated. She states she was scared of these feelings so she came in for help. Denies suicidal or homicidal ideations. She reports a history of anxiety and depression.  OB History    Gravida  2   Para  1   Term  1   Preterm      AB      Living  1     SAB      TAB      Ectopic      Multiple      Live Births              Past Medical History:  Diagnosis Date  . Anxiety   . Asthma   . Depression   . Postpartum depression   . PTSD (post-traumatic stress disorder)   . Sinus tachycardia   . Vocal cord dysfunction     Past Surgical History:  Procedure Laterality Date  . NO PAST SURGERIES      No family history on file.  Social History   Tobacco Use  . Smoking status: Never Smoker  . Smokeless tobacco: Never Used  Substance Use Topics  . Alcohol use: Not Currently  . Drug use: No    Allergies:  Allergies  Allergen Reactions  . Cinnamon Anaphylaxis    Medications Prior to Admission  Medication Sig Dispense Refill Last Dose  . albuterol (PROVENTIL HFA;VENTOLIN HFA) 108 (90 BASE) MCG/ACT inhaler Inhale 2 puffs into the lungs every 6 (six) hours as needed for wheezing or shortness of breath.   06/23/2014 at Unknown time  . albuterol (PROVENTIL) (2.5 MG/3ML) 0.083% nebulizer solution Take 3 mLs (2.5 mg total) by nebulization every 6 (six) hours  as needed for wheezing or shortness of breath. 75 mL 1   . amoxicillin-clavulanate (AUGMENTIN) 875-125 MG tablet Take 1 tablet by mouth 2 (two) times daily. One po bid x 7 days 14 tablet 0   . azithromycin (ZITHROMAX) 250 MG tablet Take 1 tablet (250 mg total) by mouth daily. 4 tablet 0   . etonogestrel (NEXPLANON) 68 MG IMPL implant 1 each by Subdermal route once.   06/23/2014 at Unknown time  . predniSONE (DELTASONE) 20 MG tablet Take 2 tablets (40 mg total) by mouth daily. 8 tablet 0     Review of Systems  Constitutional: Negative.  Negative for fatigue and fever.  HENT: Negative.   Respiratory: Negative.  Negative for shortness of breath.   Cardiovascular: Negative.  Negative for chest pain.  Gastrointestinal: Positive for abdominal pain. Negative for constipation, diarrhea, nausea and vomiting.  Genitourinary: Negative.  Negative for dysuria, vaginal bleeding and vaginal discharge.  Neurological: Negative.  Negative for dizziness and headaches.  Psychiatric/Behavioral: Negative for suicidal ideas. The patient is nervous/anxious.    Physical Exam   Blood pressure 111/64, pulse  96, temperature 98.4 F (36.9 C), temperature source Axillary, resp. rate 18, weight 105.1 kg, SpO2 100 %.  Physical Exam  Nursing note and vitals reviewed. Constitutional: She is oriented to person, place, and time. She appears well-developed and well-nourished. No distress.  HENT:  Head: Normocephalic.  Eyes: Pupils are equal, round, and reactive to light.  Cardiovascular: Normal rate, regular rhythm and normal heart sounds.  Respiratory: Effort normal and breath sounds normal. No respiratory distress.  GI: Soft. Bowel sounds are normal. She exhibits no distension. There is no tenderness.  Neurological: She is alert and oriented to person, place, and time.  Skin: Skin is warm and dry.  Psychiatric: Her behavior is normal. Judgment and thought content normal. Her mood appears anxious. She expresses no  homicidal and no suicidal ideation. She expresses no suicidal plans and no homicidal plans.  Patient tearful    MAU Course  Procedures Results for orders placed or performed during the hospital encounter of 12/15/17 (from the past 24 hour(s))  Urinalysis, Routine w reflex microscopic     Status: Abnormal   Collection Time: 12/15/17  4:49 PM  Result Value Ref Range   Color, Urine YELLOW YELLOW   APPearance HAZY (A) CLEAR   Specific Gravity, Urine 1.017 1.005 - 1.030   pH 6.0 5.0 - 8.0   Glucose, UA NEGATIVE NEGATIVE mg/dL   Hgb urine dipstick SMALL (A) NEGATIVE   Bilirubin Urine NEGATIVE NEGATIVE   Ketones, ur NEGATIVE NEGATIVE mg/dL   Protein, ur NEGATIVE NEGATIVE mg/dL   Nitrite NEGATIVE NEGATIVE   Leukocytes, UA NEGATIVE NEGATIVE   RBC / HPF 0-5 0 - 5 RBC/hpf   WBC, UA 0-5 0 - 5 WBC/hpf   Bacteria, UA RARE (A) NONE SEEN   Squamous Epithelial / LPF 0-5 0 - 5  Pregnancy, urine POC     Status: Abnormal   Collection Time: 12/15/17  4:54 PM  Result Value Ref Range   Preg Test, Ur POSITIVE (A) NEGATIVE  CBC     Status: Abnormal   Collection Time: 12/15/17  6:09 PM  Result Value Ref Range   WBC 11.1 (H) 4.0 - 10.5 K/uL   RBC 4.33 3.87 - 5.11 MIL/uL   Hemoglobin 12.2 12.0 - 15.0 g/dL   HCT 16.1 09.6 - 04.5 %   MCV 85.5 80.0 - 100.0 fL   MCH 28.2 26.0 - 34.0 pg   MCHC 33.0 30.0 - 36.0 g/dL   RDW 40.9 81.1 - 91.4 %   Platelets 315 150 - 400 K/uL   nRBC 0.0 0.0 - 0.2 %  hCG, quantitative, pregnancy     Status: Abnormal   Collection Time: 12/15/17  6:09 PM  Result Value Ref Range   hCG, Beta Chain, Quant, S 149,948 (H) <5 mIU/mL  ABO/Rh     Status: None (Preliminary result)   Collection Time: 12/15/17  6:09 PM  Result Value Ref Range   ABO/RH(D)      O POS Performed at [facility], [address], [place], [place] [address]   Wet prep, genital     Status: Abnormal   Collection Time: 12/15/17  7:36 PM  Result Value Ref Range   Yeast Wet Prep HPF POC NONE SEEN NONE  SEEN   Trich, Wet Prep NONE SEEN NONE SEEN   Clue Cells Wet Prep HPF POC NONE SEEN NONE SEEN   WBC, Wet Prep HPF POC MANY (A) NONE SEEN   Sperm NONE SEEN    MDM UA, UPT CBC,  HCG, ABO/Rh Wet prep and gc/chlamydia US OB Comp Less 14 weeks with Transvaginal  Consult to TTS- safe for discharge home, resources given  Assessment and Plan   1. Normal intrauterine pregnancy on prenatal ultrasound in first trimester   2. Abdominal pain affecting pregnancy   3. [redacted] weeks gestation of pregnancy   4. Anxiety disorder affecting pregnancy, antepartum    -Discharge home in stable condition -Rx for zoloft given to patient -First trimester precautions discussed -Patient advised to follow-up with OB of choice to start prenatal care, list given -Patient may return to MAU as needed or if her condition were to change or worsen   Rolm Bookbinder 12/15/2017, 7:58 PM

## 2017-12-15 NOTE — BH Assessment (Signed)
[facility] Assessment Progress Note    Per Reola Calkins, NP, patient does not meet inpatient treatment admission and can follow-up with outpatient provider.  Patient was given referral information for [facility] where she has been a patient as well as information on the [facility]

## 2017-12-15 NOTE — Discharge Instructions (Signed)
[facility]- [phone]  [facility]- [phone]  Center for [facility] Prenatal Care Providers          Center for [facility] @ [facility]   Phone: [phone]  Center for [facility] @ [place]   Phone: [phone]  [facility] @[place]       Phone: [phone]            Center for [facility] @ [place]     Phone: [phone]          Center for [facility] @ [place]   Phone: [phone]  Center for [facility] @ [place]  Phone: [phone]     Family [address] [place])  Phone: [phone] [facility] Providers    Center for [facility] at [facility]       Phone: [phone]  Center for [facility] at [place] Phone: [phone]  [facility] at [place]  Phone: [phone]  [facility] for [facility] at [facility]  Phone: [phone]  Center for [facility] at [place]  Phone: [phone]  [facility]       Phone: [phone]  [facility]    Phone: [phone]   [facility] [place])    Phone: [phone]  [facility]    Phone: [phone]  [facility]    Phone: [phone]  [facility]    Phone: [phone]  [facility]       Phone: [phone]   [facility]-M[facility]  Phone: [phone]  [facility]    Phone: [phone]  [facility]   Phone: [phone]  [facility]      Phone: [phone]  [facility]    Phone: [phone]  Safe Medications in Pregnancy   Acne: Benzoyl Peroxide Salicylic Acid  Backache/Headache: Tylenol: 2 regular strength every 4 hours OR              2 Extra strength every 6  hours  Colds/Coughs/Allergies: Benadryl (alcohol free) 25 mg every 6 hours as needed Breath right strips Claritin Cepacol throat lozenges Chloraseptic throat spray Cold-Eeze- up to three times per day Cough drops, alcohol free Flonase (by prescription only) Guaifenesin Mucinex Robitussin DM (plain only, alcohol free) Saline nasal spray/drops Sudafed (pseudoephedrine) & Actifed ** use only after [redacted] weeks gestation and if you do not have high blood pressure Tylenol Vicks Vaporub Zinc lozenges Zyrtec   Constipation: Colace Ducolax suppositories Fleet enema Glycerin suppositories Metamucil Milk of magnesia Miralax Senokot Smooth move tea  Diarrhea: Kaopectate Imodium A-D  *NO pepto Bismol  Hemorrhoids: Anusol Anusol HC Preparation H Tucks  Indigestion: Tums Maalox Mylanta Zantac  Pepcid  Insomnia: Benadryl (alcohol free) 25mg  every 6 hours as needed Tylenol PM Unisom, no Gelcaps  Leg Cramps: Tums MagGel  Nausea/Vomiting:  Bonine Dramamine Emetrol Ginger extract Sea bands Meclizine  Nausea medication to take during pregnancy:  Unisom (doxylamine succinate 25 mg tablets) Take one tablet daily at bedtime. If symptoms are not adequately controlled, the dose can be increased to a maximum recommended dose of two tablets daily (1/2 tablet in the morning, 1/2 tablet mid-afternoon and one at bedtime). Vitamin B6 100mg  tablets. Take one tablet twice a day (up to 200 mg per day).  Skin Rashes: Aveeno products Benadryl cream or 25mg  every 6 hours as needed Calamine Lotion 1% cortisone cream  Yeast infection: Gyne-lotrimin  7 Monistat 7   **If taking multiple medications, please check labels to avoid duplicating the same active ingredients **take medication as directed on the label ** Do not exceed 4000 mg of tylenol in 24 hours **Do not take medications that contain aspirin or ibuprofen

## 2017-12-15 NOTE — Progress Notes (Signed)
Patient is seen by me via tele-psych and I have consulted with Dr. Lucianne Muss.  Patient denies any suicidal or homicidal ideations and denies any hallucinations.  She reports having some worsening anxiety and panic attacks.  She states that he just worries her since she is just recently found out that she is pregnant.  She denies knowing how long she has been pregnant currently.  She denies any substance use.  She is instructed to follow-up with her outpatient resources and she will be provided with additional resources in case her previous outpatient provider is not available.  Patient does not meet inpatient criteria and is psychiatrically cleared.  I attempted to contact her midwife Iven Finn and she was busy at the moment.  Ms. Jennette Kettle is welcome to contact me back if she has any further questions.
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
## 2017-12-18 LAB — GC/CHLAMYDIA PROBE AMP (~~LOC~~) NOT AT ARMC
Chlamydia: NEGATIVE
Neisseria Gonorrhea: NEGATIVE

## 2018-10-14 ENCOUNTER — Encounter (HOSPITAL_COMMUNITY): Payer: Self-pay

## 2018-12-16 HISTORY — PX: LUMBAR DISC SURGERY: SHX700

## 2018-12-18 DIAGNOSIS — Z01818 Encounter for other preprocedural examination: Secondary | ICD-10-CM

## 2019-05-14 ENCOUNTER — Other Ambulatory Visit: Payer: Self-pay

## 2019-05-14 ENCOUNTER — Encounter: Payer: Self-pay | Admitting: Neurology

## 2019-05-14 ENCOUNTER — Ambulatory Visit (INDEPENDENT_AMBULATORY_CARE_PROVIDER_SITE_OTHER): Payer: Managed Care, Other (non HMO) | Admitting: Neurology

## 2019-05-14 ENCOUNTER — Telehealth: Payer: Self-pay | Admitting: Neurology

## 2019-05-14 VITALS — BP 132/73 | HR 90 | Temp 97.4°F | Ht 65.0 in | Wt 232.5 lb

## 2019-05-14 DIAGNOSIS — R404 Transient alteration of awareness: Secondary | ICD-10-CM | POA: Diagnosis not present

## 2019-05-14 NOTE — Telephone Encounter (Signed)
[employer] order sent to GI. They will obtain the auth and reach out to the patient to schedule.  

## 2019-05-14 NOTE — Progress Notes (Signed)
PATIENT: Jasmine Gates DOB: 1990-10-16  Chief Complaint  Patient presents with  . Possible Seizures    She is here with her husband, [name]. Reports two separate passing out episodes that lasted a few seconds. Her husband witnessed both times.  States she had eye rolling and upper limb tremors. The patient does not recall this happening. She experienced fatigue and vision changes afterwards.   Marland Kitchen PCP    Harlen Labs, NP     HISTORICAL  Jasmine Gates is a 29 year old female, seen in request by her primary care nurse practitioner Harlen Labs for evaluation of passing out spells, initial evaluation was on May 14, 2019, with her husband.  I have reviewed and summarized the referring note from the referring physician.  She reported 2 episode of passing out spells  The first episode was in 2020, she suffered food poisoning, with frequent nausea, diarrhea, dehydration, was sitting on the toilet, she fell to the floor, she was able to manage back to the toilet again, when her husband heard the thump and checking on her, she was already back on the toilet, was noted to have eyes rolled back, arm dropped to her chest, body rigid shaking for few seconds, she was taken to [facility], improved with IV fluid  Second episode was in November 2020, few days after her lumbar spine surgery for her left lumbar radiculopathy, she was taking stool softener, had diarrhea, was again sitting on the toilet, she felt really sick, tired, going to pass out, she was able to call her husband, then was noted to have similar eyes rolled back, body jerking movement, improved few seconds, and she only has transient postevent confusion, symptoms improved after resting in her bed   REVIEW OF SYSTEMS: Full 14 system review of systems performed and notable only for as above All other review of systems were negative.  ALLERGIES: Allergies  Allergen Reactions  . Cinnamon Anaphylaxis    HOME  MEDICATIONS: No current outpatient medications on file.   No current facility-administered medications for this visit.    PAST MEDICAL HISTORY: Past Medical History:  Diagnosis Date  . Anxiety   . Asthma   . Depression   . Postpartum depression   . PTSD (post-traumatic stress disorder)   . Ruptured lumbar disc   . Sinus tachycardia   . Vocal cord dysfunction     PAST SURGICAL HISTORY: Past Surgical History:  Procedure Laterality Date  . LUMBAR DISC SURGERY  12/2018    FAMILY HISTORY: Family History  Problem Relation Age of Onset  . Healthy Mother   . Healthy Father   . Heart disease Maternal Grandmother   . Throat cancer Maternal Grandfather     SOCIAL HISTORY: Social History   Socioeconomic History  . Marital status: Married    Spouse name: Not on file  . Number of children: 1  . Years of education: 68  . Highest education level: High school graduate  Occupational History  . Occupation: birth Environmental manager  Tobacco Use  . Smoking status: Never Smoker  . Smokeless tobacco: Never Used  Substance and Sexual Activity  . Alcohol use: Yes    Comment: occasional wine  . Drug use: No  . Sexual activity: Yes    Birth control/protection: None  Other Topics Concern  . Not on file  Social History Narrative   Lives at home with husband.   Right-handed.   Caffeine use: 1 cup per day.   Social Determinants of Health  Financial Resource Strain:   . Difficulty of Paying Living Expenses:   Food Insecurity:   . Worried About Programme researcher, broadcasting/film/video in the Last Year:   . Barista in the Last Year:   Transportation Needs:   . Freight forwarder (Medical):   Marland Kitchen Lack of Transportation (Non-Medical):   Physical Activity:   . Days of Exercise per Week:   . Minutes of Exercise per Session:   Stress:   . Feeling of Stress :   Social Connections:   . Frequency of Communication with Friends and Family:   . Frequency of Social Gatherings with Friends and Family:    . Attends Religious Services:   . Active Member of Clubs or Organizations:   . Attends Banker Meetings:   Marland Kitchen Marital Status:   Intimate Partner Violence:   . Fear of Current or Ex-Partner:   . Emotionally Abused:   Marland Kitchen Physically Abused:   . Sexually Abused:      PHYSICAL EXAM   Vitals:   05/14/19 0914  BP: 132/73  Pulse: 90  Temp: (!) 97.4 F (36.3 C)  Weight: 232 lb 8 oz (105.5 kg)  Height: 5\' 5"  (1.651 m)    Not recorded      Body mass index is 38.69 kg/m.  PHYSICAL EXAMNIATION:  Gen: NAD, conversant, well nourised, well groomed                     Cardiovascular: Regular rate rhythm, no peripheral edema, warm, nontender. Eyes: Conjunctivae clear without exudates or hemorrhage Neck: Supple, no carotid bruits. Pulmonary: Clear to auscultation bilaterally   NEUROLOGICAL EXAM:  MENTAL STATUS: Speech:    Speech is normal; fluent and spontaneous with normal comprehension.  Cognition:     Orientation to time, place and person     Normal recent and remote memory     Normal Attention span and concentration     Normal Language, naming, repeating,spontaneous speech     Fund of knowledge   CRANIAL NERVES: CN II: Visual fields are full to confrontation. Pupils are round equal and briskly reactive to light. CN III, IV, VI: extraocular movement are normal. No ptosis. CN V: Facial sensation is intact to light touch CN VII: Face is symmetric with normal eye closure  CN VIII: Hearing is normal to causal conversation. CN IX, X: Phonation is normal. CN XI: Head turning and shoulder shrug are intact  MOTOR: There is no pronator drift of out-stretched arms. Muscle bulk and tone are normal. Muscle strength is normal.  REFLEXES: Reflexes are 2+ and symmetric at the biceps, triceps, knees, and ankles. Plantar responses are flexor.  SENSORY: Intact to light touch, pinprick and vibratory sensation are intact in fingers and toes.  COORDINATION: There is no  trunk or limb dysmetria noted.  GAIT/STANCE: Posture is normal. Gait is steady with normal steps, base, arm swing, and turning. Heel and toe walking are normal. Tandem gait is normal.  Romberg is absent.   DIAGNOSTIC DATA (LABS, IMAGING, TESTING) - I reviewed patient records, labs, notes, testing and imaging myself where available.   ASSESSMENT AND PLAN  Soren Pigman is a 29 y.o. female   Passing out spells  Differentiation diagnosis include syncope versus seizure  Both episodes happened in the setting of diarrhea, dehydration, syncope is high on the differentiation diagnosis  Proceed with MRI of the brain with without contrast  EEG  Laboratory evaluation including CBC, CMP, TSH,  Call  clinic for recurrent spells   Marcial Pacas, M.D. Ph.D.  [facility] [address] [place], [place] [address] Ph: [phone] Fax: [phone]  ID:[id number], Geni Bers, NP

## 2019-05-15 ENCOUNTER — Telehealth: Payer: Self-pay | Admitting: Neurology

## 2019-05-15 LAB — COMPREHENSIVE METABOLIC PANEL
ALT: 135 IU/L — ABNORMAL HIGH (ref 0–32)
AST: 109 IU/L — ABNORMAL HIGH (ref 0–40)
Albumin/Globulin Ratio: 2 (ref 1.2–2.2)
Albumin: 4.7 g/dL (ref 3.9–5.0)
Alkaline Phosphatase: 104 IU/L (ref 39–117)
BUN/Creatinine Ratio: 17 (ref 9–23)
BUN: 10 mg/dL (ref 6–20)
Bilirubin Total: 0.3 mg/dL (ref 0.0–1.2)
CO2: 23 mmol/L (ref 20–29)
Calcium: 9.8 mg/dL (ref 8.7–10.2)
Chloride: 102 mmol/L (ref 96–106)
Creatinine, Ser: 0.59 mg/dL (ref 0.57–1.00)
GFR calc Af Amer: 146 mL/min/{1.73_m2} (ref >59)
GFR calc non Af Amer: 127 mL/min/{1.73_m2} (ref >59)
Globulin, Total: 2.3 g/dL (ref 1.5–4.5)
Glucose: 99 mg/dL (ref 65–99)
Potassium: 4.7 mmol/L (ref 3.5–5.2)
Sodium: 138 mmol/L (ref 134–144)
Total Protein: 7 g/dL (ref 6.0–8.5)

## 2019-05-15 LAB — CBC WITH DIFFERENTIAL/PLATELET
Basophils Absolute: 0 10*3/uL (ref 0.0–0.2)
Basos: 0 %
EOS (ABSOLUTE): 0.3 10*3/uL (ref 0.0–0.4)
Eos: 3 %
Hematocrit: 39.8 % (ref 34.0–46.6)
Hemoglobin: 13.4 g/dL (ref 11.1–15.9)
Immature Grans (Abs): 0 10*3/uL (ref 0.0–0.1)
Immature Granulocytes: 0 %
Lymphocytes Absolute: 1.9 10*3/uL (ref 0.7–3.1)
Lymphs: 23 %
MCH: 28.3 pg (ref 26.6–33.0)
MCHC: 33.7 g/dL (ref 31.5–35.7)
MCV: 84 fL (ref 79–97)
Monocytes Absolute: 0.5 10*3/uL (ref 0.1–0.9)
Monocytes: 6 %
Neutrophils Absolute: 5.4 10*3/uL (ref 1.4–7.0)
Neutrophils: 68 %
Platelets: 302 10*3/uL (ref 150–450)
RBC: 4.74 x10E6/uL (ref 3.77–5.28)
RDW: 12.8 % (ref 11.7–15.4)
WBC: 8.1 10*3/uL (ref 3.4–10.8)

## 2019-05-15 LAB — TSH: TSH: 1.56 u[IU]/mL (ref 0.450–4.500)

## 2019-05-15 NOTE — Telephone Encounter (Signed)
Left message requesting a call back to review results.

## 2019-05-15 NOTE — Telephone Encounter (Signed)
Please call patient, laboratory evaluation showed  abnormal liver functional test, Alkaline Phosphatase 39 - 117 IU/L 104   AST 0 - 40 IU/L 109High    ALT 0 - 32 IU/L 135High     Which was present at previous testing 2015, please ask her if she has any risk factors for abnormal liver functional test, such as long-term moderate alcohol use, history of hepatitis,  Alkaline Phosphatase   140  ALT`113  AST78

## 2019-05-16 NOTE — Telephone Encounter (Signed)
I was unable to reach the patient again today. I spoke to her husband, [name] (on [place]). I provided him with the lab results and he will share them with her. She would need to follow up on these findings with her PCP. He will have her call to make an appt. The results were faxed to her PCP office - attention Harlen Labs, NP (fax: [phone]).

## 2019-05-28 ENCOUNTER — Other Ambulatory Visit: Payer: Self-pay

## 2019-05-28 ENCOUNTER — Ambulatory Visit
Admission: RE | Admit: 2019-05-28 | Discharge: 2019-05-28 | Disposition: A | Discharge status: Home / Self Care | Payer: Managed Care, Other (non HMO) | Source: Ambulatory Visit | Attending: Neurology | Admitting: Neurology

## 2019-05-28 DIAGNOSIS — R404 Transient alteration of awareness: Secondary | ICD-10-CM

## 2019-05-28 MED ORDER — GADOBENATE DIMEGLUMINE 529 MG/ML IV SOLN
20.0000 mL | Freq: Once | INTRAVENOUS | Status: AC | PRN
  Administered 2019-05-28: 20 mL via INTRAVENOUS

## 2019-05-30 ENCOUNTER — Ambulatory Visit (INDEPENDENT_AMBULATORY_CARE_PROVIDER_SITE_OTHER): Payer: Managed Care, Other (non HMO) | Admitting: Neurology

## 2019-05-30 ENCOUNTER — Other Ambulatory Visit: Payer: Self-pay

## 2019-05-30 DIAGNOSIS — R401 Stupor: Secondary | ICD-10-CM

## 2019-05-30 DIAGNOSIS — R404 Transient alteration of awareness: Secondary | ICD-10-CM

## 2019-06-14 NOTE — Procedures (Signed)
   HISTORY: 29 year old female presented with passing out episode  TECHNIQUE:  This is a routine 16 channel EEG recording with one channel devoted to a limited EKG recording.  It was performed during wakefulness, drowsiness and asleep.  Hyperventilation and photic stimulation were performed as activating procedures.  There are minimum muscle and movement artifact noted.  Upon maximum arousal, posterior dominant waking rhythm consistent of rhythmic alpha range activity, with frequency of 11 hz. Activities are symmetric over the bilateral posterior derivations and attenuated with eye opening.  Hyperventilation produced mild/moderate buildup with higher amplitude and the slower activities noted.  Photic stimulation did not alter the tracing.  During EEG recording, patient developed drowsiness and entered sleep, sleep EEG demonstrated architecture, there were frontal centrally dominant vertex waves and symmetric sleep spindles noted.  During EEG recording, there was no epileptiform discharge noted.  EKG demonstrate sinus rhythm, with heart rate of 92 bpm  CONCLUSION: This is a  normal awake and sleep EEG.  There is no electrodiagnostic evidence of epileptiform discharge.  Levert Feinstein, M.D. Ph.D.  [facility] [address] [place], [place] [address] Phone: [phone] Fax:      [phone]

## 2019-11-14 ENCOUNTER — Ambulatory Visit: Payer: Managed Care, Other (non HMO) | Admitting: Neurology

## 2020-03-27 IMAGING — US US OB COMP LESS 14 WK
1 series · 15 of 20 positions shown · non-contrast
Comparison: None.

CLINICAL DATA: Pelvic pain for 2 days. Elevated quantitative beta
HCG. Unknown last menstrual period.

EXAM:
OBSTETRIC <14 WK ULTRASOUND
TECHNIQUE: Transabdominal ultrasound was performed for evaluation of the
gestation as well as the maternal uterus and adnexal regions.

[Series 1: us ob comp less 14 wk · 15 of 20 slices shown]
[im 1/20]
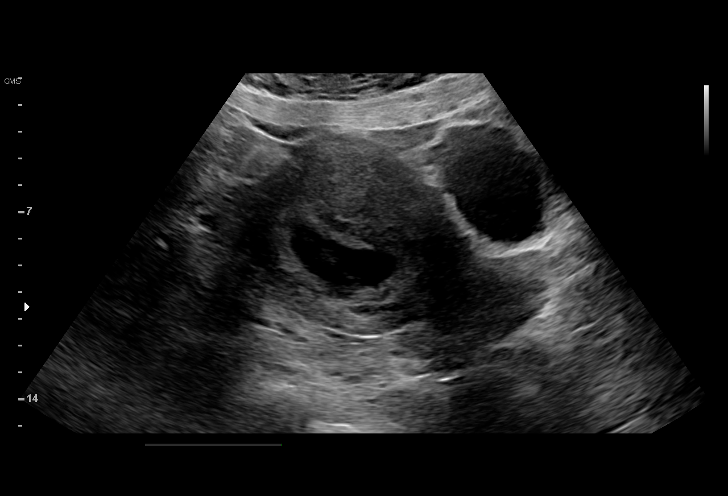
[im 3/20]
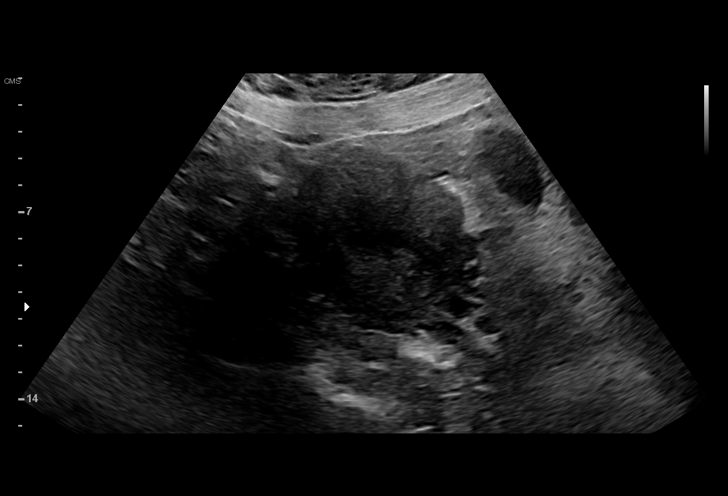
[im 4/20]
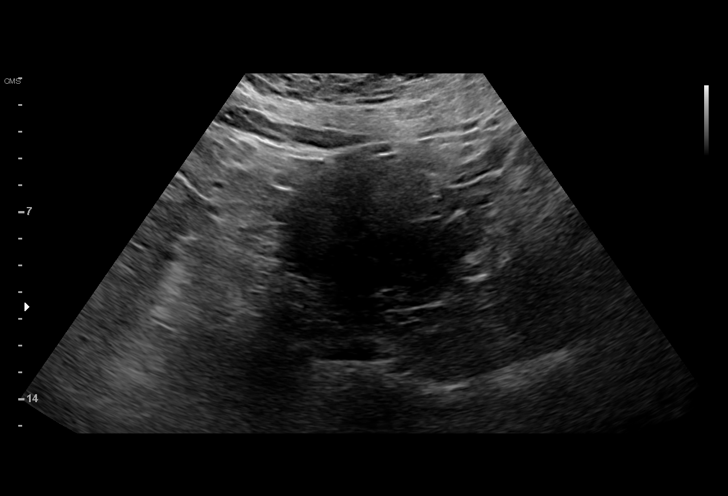
[im 5/20]
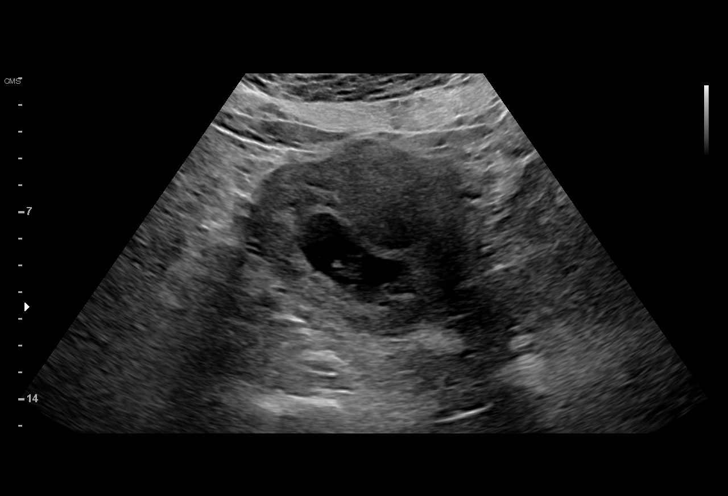
[im 7/20]
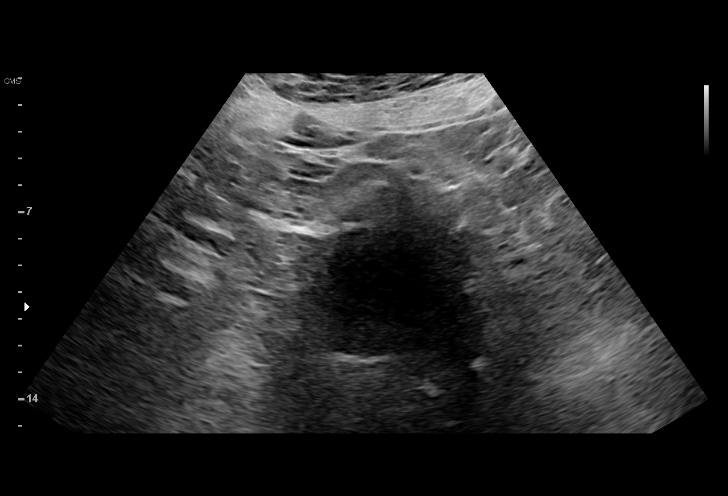
[im 8/20]
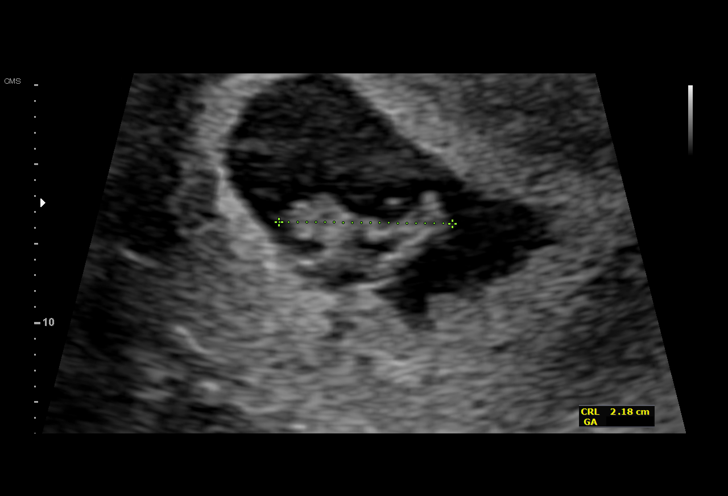
[im 9/20]
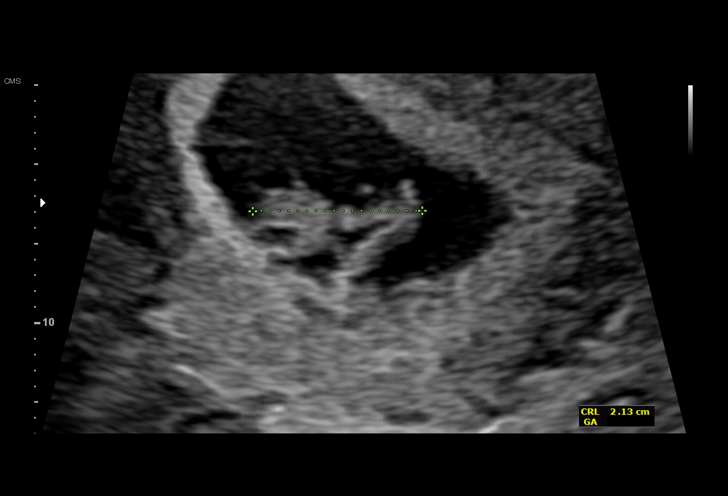
[im 11/20]
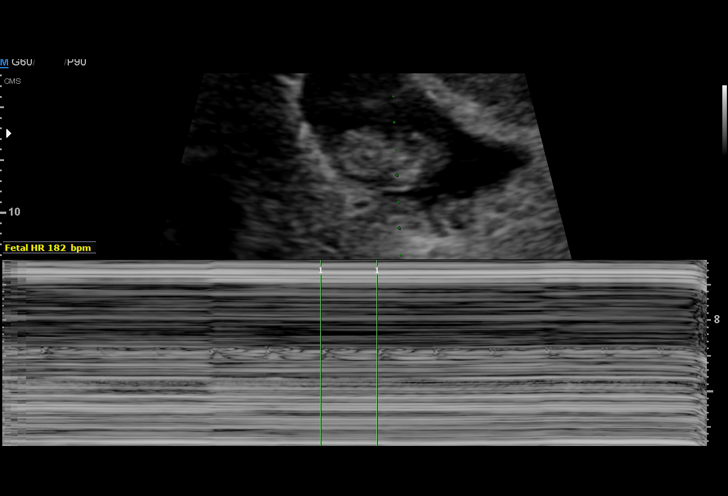
[im 12/20]
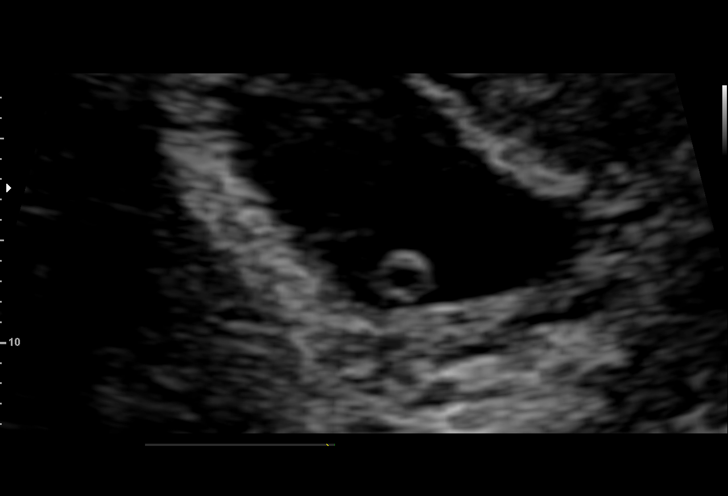
[im 13/20]
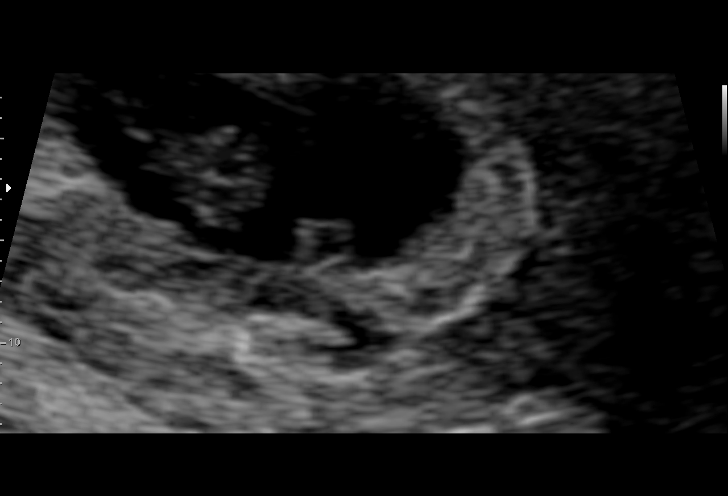
[im 15/20]
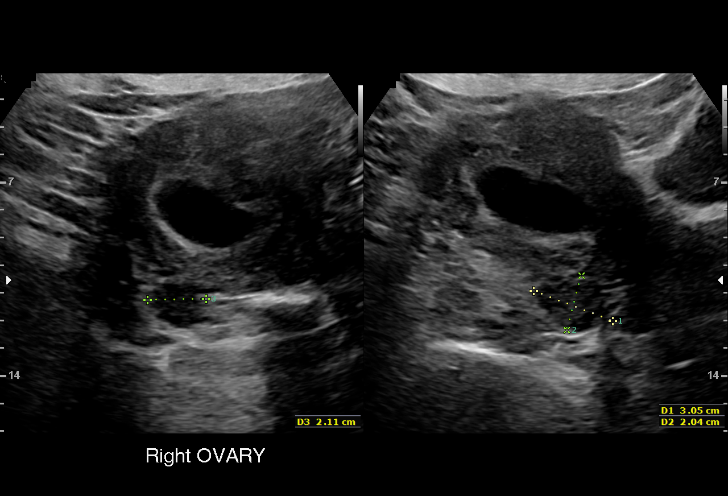
[im 16/20]
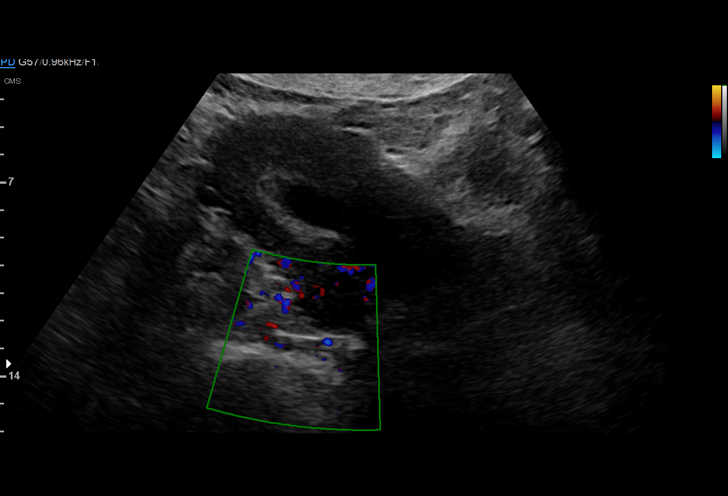
[im 17/20]
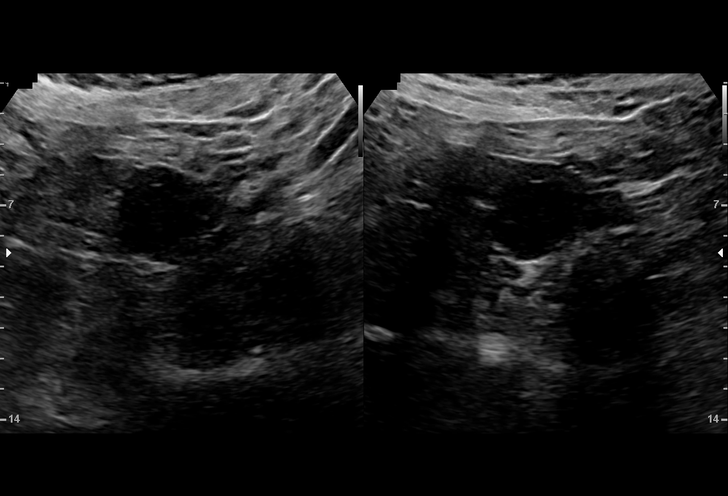
[im 19/20]
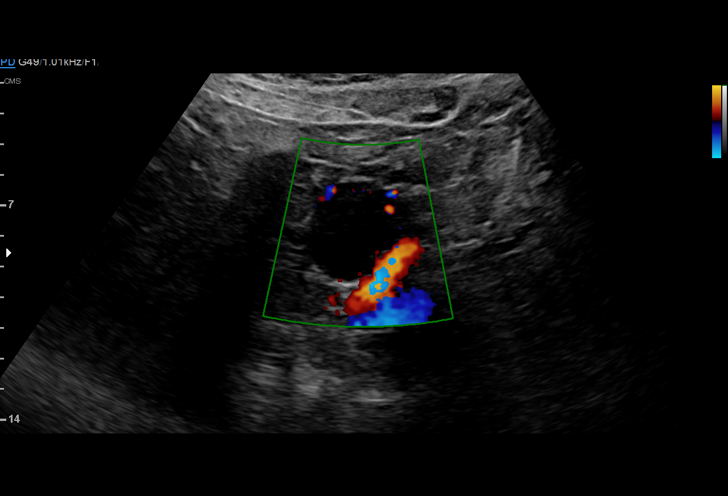
[im 20/20]
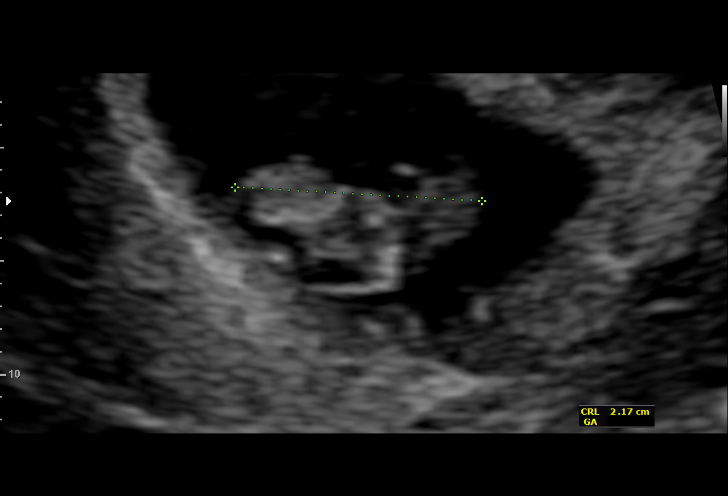

[15 of 20 positions shown; findings below may reference images not displayed]

FINDINGS: Intrauterine gestational sac: Single

Yolk sac:  Visualized.

Embryo:  Visualized.

Cardiac Activity: Visualized.

Heart Rate: 182 bpm

CRL: 21.3 mm   8 w 5 d                  US EDC: 07/22/2018

Subchorionic hemorrhage:  None visualized.

Maternal uterus/adnexae: Normal.
IMPRESSION: Single live intrauterine pregnancy corresponding to 8 weeks and 5
days gestation without complicating features.
